# Patient Record
Sex: Female | Born: 1990 | Race: White | Hispanic: No | Marital: Single | Smoking: Never smoker
Health system: Southern US, Community
[De-identification: ages and names within clinical notes are randomized; demographics above are authoritative.]

## PROBLEM LIST (undated history)

## (undated) DIAGNOSIS — I471 Supraventricular tachycardia: Secondary | ICD-10-CM

## (undated) HISTORY — PX: CARDIAC ELECTROPHYSIOLOGY STUDY AND ABLATION: SHX1294

---
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
## 2012-10-22 ENCOUNTER — Emergency Department: Payer: Self-pay | Admitting: Emergency Medicine

## 2012-10-23 LAB — COMPREHENSIVE METABOLIC PANEL
Albumin: 3.7 g/dL (ref 3.4–5.0)
Anion Gap: 4 — ABNORMAL LOW (ref 7–16)
Bilirubin,Total: 0.3 mg/dL (ref 0.2–1.0)
Calcium, Total: 9.5 mg/dL (ref 8.5–10.1)
Chloride: 109 mmol/L — ABNORMAL HIGH (ref 98–107)
Co2: 27 mmol/L (ref 21–32)
EGFR (Non-African Amer.): >60
Osmolality: 279 (ref 275–301)
Sodium: 140 mmol/L (ref 136–145)
Total Protein: 6.7 g/dL (ref 6.4–8.2)

## 2012-10-23 LAB — CBC
MCH: 30.3 pg (ref 26.0–34.0)
MCHC: 34.8 g/dL (ref 32.0–36.0)
Platelet: 288 10*3/uL (ref 150–440)
RDW: 12.5 % (ref 11.5–14.5)
WBC: 7.5 10*3/uL (ref 3.6–11.0)

## 2013-04-23 ENCOUNTER — Ambulatory Visit: Payer: Self-pay | Admitting: General Practice

## 2013-06-25 ENCOUNTER — Ambulatory Visit: Payer: Self-pay | Admitting: General Practice

## 2015-04-12 ENCOUNTER — Encounter: Payer: Self-pay | Admitting: Urgent Care

## 2015-04-12 ENCOUNTER — Observation Stay
Admission: EM | Admit: 2015-04-12 | Discharge: 2015-04-14 | Disposition: A | Discharge status: Home / Self Care | Payer: BLUE CROSS/BLUE SHIELD | Attending: Internal Medicine | Admitting: Internal Medicine

## 2015-04-12 DIAGNOSIS — F329 Major depressive disorder, single episode, unspecified: Secondary | ICD-10-CM | POA: Insufficient documentation

## 2015-04-12 DIAGNOSIS — R55 Syncope and collapse: Secondary | ICD-10-CM | POA: Diagnosis not present

## 2015-04-12 DIAGNOSIS — R000 Tachycardia, unspecified: Secondary | ICD-10-CM | POA: Diagnosis not present

## 2015-04-12 DIAGNOSIS — K3580 Unspecified acute appendicitis: Principal | ICD-10-CM | POA: Insufficient documentation

## 2015-04-12 DIAGNOSIS — Z8679 Personal history of other diseases of the circulatory system: Secondary | ICD-10-CM | POA: Insufficient documentation

## 2015-04-12 DIAGNOSIS — K37 Unspecified appendicitis: Secondary | ICD-10-CM | POA: Insufficient documentation

## 2015-04-12 DIAGNOSIS — R110 Nausea: Secondary | ICD-10-CM | POA: Diagnosis not present

## 2015-04-12 DIAGNOSIS — R002 Palpitations: Secondary | ICD-10-CM | POA: Diagnosis not present

## 2015-04-12 DIAGNOSIS — R531 Weakness: Secondary | ICD-10-CM | POA: Insufficient documentation

## 2015-04-12 DIAGNOSIS — R509 Fever, unspecified: Secondary | ICD-10-CM | POA: Diagnosis not present

## 2015-04-12 DIAGNOSIS — Z79899 Other long term (current) drug therapy: Secondary | ICD-10-CM | POA: Diagnosis not present

## 2015-04-12 DIAGNOSIS — Z9889 Other specified postprocedural states: Secondary | ICD-10-CM | POA: Diagnosis not present

## 2015-04-12 DIAGNOSIS — J111 Influenza due to unidentified influenza virus with other respiratory manifestations: Secondary | ICD-10-CM | POA: Diagnosis not present

## 2015-04-12 DIAGNOSIS — B2790 Infectious mononucleosis, unspecified without complication: Secondary | ICD-10-CM

## 2015-04-12 DIAGNOSIS — R109 Unspecified abdominal pain: Secondary | ICD-10-CM | POA: Diagnosis present

## 2015-04-12 HISTORY — DX: Supraventricular tachycardia: I47.1

## 2015-04-12 LAB — URINALYSIS COMPLETE WITH MICROSCOPIC (ARMC ONLY)
BILIRUBIN URINE: NEGATIVE
Bacteria, UA: NONE SEEN
GLUCOSE, UA: NEGATIVE mg/dL
HGB URINE DIPSTICK: NEGATIVE
KETONES UR: NEGATIVE mg/dL
Leukocytes, UA: NEGATIVE
NITRITE: NEGATIVE
Protein, ur: NEGATIVE mg/dL
SPECIFIC GRAVITY, URINE: 1.004 — AB (ref 1.005–1.030)
pH: 7 (ref 5.0–8.0)

## 2015-04-12 LAB — CBC
HCT: 39.2 % (ref 35.0–47.0)
HEMOGLOBIN: 13.5 g/dL (ref 12.0–16.0)
MCH: 30.1 pg (ref 26.0–34.0)
MCHC: 34.4 g/dL (ref 32.0–36.0)
MCV: 87.5 fL (ref 80.0–100.0)
PLATELETS: 276 10*3/uL (ref 150–440)
RBC: 4.48 MIL/uL (ref 3.80–5.20)
RDW: 12.8 % (ref 11.5–14.5)
WBC: 7.5 10*3/uL (ref 3.6–11.0)

## 2015-04-12 LAB — COMPREHENSIVE METABOLIC PANEL
ALBUMIN: 4.1 g/dL (ref 3.5–5.0)
ALK PHOS: 50 U/L (ref 38–126)
ALT: 16 U/L (ref 14–54)
ANION GAP: 7 (ref 5–15)
AST: 19 U/L (ref 15–41)
BILIRUBIN TOTAL: 0.1 mg/dL — AB (ref 0.3–1.2)
BUN: 9 mg/dL (ref 6–20)
CALCIUM: 8.6 mg/dL — AB (ref 8.9–10.3)
CO2: 26 mmol/L (ref 22–32)
CREATININE: 0.67 mg/dL (ref 0.44–1.00)
Chloride: 97 mmol/L — ABNORMAL LOW (ref 101–111)
GFR calc Af Amer: >60 mL/min (ref >60)
GFR calc non Af Amer: >60 mL/min (ref >60)
GLUCOSE: 92 mg/dL (ref 65–99)
Potassium: 3.4 mmol/L — ABNORMAL LOW (ref 3.5–5.1)
SODIUM: 130 mmol/L — AB (ref 135–145)
TOTAL PROTEIN: 7.6 g/dL (ref 6.5–8.1)

## 2015-04-12 LAB — RAPID INFLUENZA A&B ANTIGENS: Influenza B (ARMC): NEGATIVE

## 2015-04-12 LAB — LIPASE, BLOOD: Lipase: 23 U/L (ref 11–51)

## 2015-04-12 LAB — RAPID INFLUENZA A&B ANTIGENS (ARMC ONLY): INFLUENZA A (ARMC): NEGATIVE

## 2015-04-12 MED ORDER — DIATRIZOATE MEGLUMINE & SODIUM 66-10 % PO SOLN
15.0000 mL | ORAL | Status: AC
  Administered 2015-04-12 – 2015-04-13 (×2): 15 mL via ORAL

## 2015-04-12 MED ORDER — SODIUM CHLORIDE 0.9 % IV BOLUS (SEPSIS)
1000.0000 mL | Freq: Once | INTRAVENOUS | Status: AC
  Administered 2015-04-12: 1000 mL via INTRAVENOUS

## 2015-04-12 MED ORDER — ACETAMINOPHEN 500 MG PO TABS
ORAL_TABLET | ORAL | Status: AC
  Filled 2015-04-12: qty 2

## 2015-04-12 MED ORDER — ACETAMINOPHEN 500 MG PO TABS
1000.0000 mg | ORAL_TABLET | Freq: Once | ORAL | Status: AC
  Administered 2015-04-12: 1000 mg via ORAL

## 2015-04-12 NOTE — ED Notes (Signed)
Patient presents with flu like symptoms that began yesterday. Denies N/V. (+) diffuse abdominal pain. Patient experienced a syncopal episode yesterday; reports LOC x 1-2 minutes. [facility] and was sent here for further evaluation and treatment.

## 2015-04-12 NOTE — ED Provider Notes (Signed)
[facility] Emergency Department Provider Note  ____________________________________________  Time seen: 11:30 PM  I have reviewed the triage vital signs and the nursing notes.   HISTORY  Chief Complaint Influenza; Loss of Consciousness; and Abdominal Pain     HPI Jill Velasquez is a 25 y.o. female presents with flulike symptoms that started yesterday including fever generalized body aches including neck and back, diffuse abdominal pain congestion. Patient admits to a syncopal episode and lasted approximately 2 minutes day before yesterday. Patient states that she thought this was her "orthostatic hypotension".     Past Medical History  Diagnosis Date  . SVT (supraventricular tachycardia) (HCC)     as a child; stopped routine cardiology visits at age 43    There are no active problems to display for this patient.   Past Surgical History  Procedure Laterality Date  . Cardiac electrophysiology study and ablation      No current outpatient prescriptions on file.  Allergies No known drug allergies  No family history on file.  Social History Social History  Substance Use Topics  . Smoking status: Never Smoker   . Smokeless tobacco: None  . Alcohol Use: No    Review of Systems  Constitutional: positive for fever. Eyes: Negative for visual changes. ENT: Negative for sore throat. Cardiovascular: Negative for chest pain. Respiratory: Negative for shortness of breath. Gastrointestinal: Positive abdominal pain Genitourinary: Negative for dysuria. Musculoskeletal: Negative for back pain. Skin: Negative for rash. Neurological: Negative for headaches, focal weakness or numbness.   10-point ROS otherwise negative.  ____________________________________________   PHYSICAL EXAM:  VITAL SIGNS: ED Triage Vitals  Enc Vitals Group     BP 04/12/15 1928 109/71 mmHg     Pulse Rate 04/12/15 1928 130     Resp 04/12/15 1928 22     Temp  04/12/15 1928 101.8 F (38.8 C)     Temp Source 04/12/15 1928 Oral     SpO2 04/12/15 1928 100 %     Weight 04/12/15 1928 115 lb (52.164 kg)     Height 04/12/15 1928  (1.626 m)     Head Cir --      Peak Flow --      Pain Score 04/12/15 1929 5     Pain Loc --      Pain Edu? --      Excl. in GC? --      Constitutional: Alert and oriented. Well appearing and in no distress. Eyes: Conjunctivae are normal. PERRL. Normal extraocular movements. ENT   Head: Normocephalic and atraumatic.   Nose: No congestion/rhinnorhea.   Mouth/Throat: Mucous membranes are moist.   Neck: No stridor. Hematological/Lymphatic/Immunilogical: No cervical lymphadenopathy. Cardiovascular: Normal rate, regular rhythm. Normal and symmetric distal pulses are present in all extremities. No murmurs, rubs, or gallops. Respiratory: Normal respiratory effort without tachypnea nor retractions. Breath sounds are clear and equal bilaterally. No wheezes/rales/rhonchi. Gastrointestinal: RLQ pain No distention. There is no CVA tenderness. Genitourinary: deferred Musculoskeletal: Nontender with normal range of motion in all extremities. No joint effusions.  No lower extremity tenderness nor edema. Neurologic:  Normal speech and language. No gross focal neurologic deficits are appreciated. Speech is normal.  Skin:  Skin is warm, dry and intact. No rash noted. Psychiatric: Mood and affect are normal. Speech and behavior are normal. Patient exhibits appropriate insight and judgment.  ____________________________________________    LABS (pertinent positives/negatives)  Labs Reviewed  COMPREHENSIVE METABOLIC PANEL - Abnormal; Notable for the following:  Sodium 130 (*)    Potassium 3.4 (*)    Chloride 97 (*)    Calcium 8.6 (*)    Total Bilirubin 0.1 (*)    All other components within normal limits  URINALYSIS COMPLETEWITH MICROSCOPIC ([facility] ONLY) - Abnormal; Notable for the following:    Color, Urine  STRAW (*)    APPearance CLEAR (*)    Specific Gravity, Urine 1.004 (*)    Squamous Epithelial / LPF 0-5 (*)    All other components within normal limits  MONONUCLEOSIS SCREEN - Abnormal; Notable for the following:    Mono Screen POSITIVE (*)    All other components within normal limits  RAPID INFLUENZA A&B ANTIGENS ([facility] ONLY)  LIPASE, BLOOD  CBC  PREGNANCY, URINE  INFLUENZA PANEL BY PCR (TYPE A & B, H1N1)  POC URINE PREG, ED     __    RADIOLOGY  CT Abdomen Pelvis W Contrast (Final result) Result time: 04/13/15 02:04:49   Final result by Rad Results In Interface (04/13/15 02:04:49)   Narrative:   CLINICAL DATA: Flu-like symptoms yesterday. Diffuse abdominal pain tonight.  EXAM: CT ABDOMEN AND PELVIS WITH CONTRAST  TECHNIQUE: Multidetector CT imaging of the abdomen and pelvis was performed using the standard protocol following bolus administration of intravenous contrast.  CONTRAST: 100mL ISOVUE-300 IOPAMIDOL (ISOVUE-300) INJECTION 61%  COMPARISON: None.  FINDINGS: There is inflammation and enlargement of the appendix with appearances typical of acute appendicitis. No abscess. No extraluminal air. No other acute findings are evident in the abdomen or pelvis.  There are normal appearances of the liver, gallbladder, bile ducts, pancreas, spleen, adrenals and kidneys. Uterus and ovaries appear unremarkable.  There is no significant abnormality in the lower chest.  There is no significant musculoskeletal abnormality.  IMPRESSION: Acute appendicitis. These results were called by telephone at the time of interpretation on 04/13/2015 at 2:03 am to Dr. Bayard MalesANDOLPH BROWN , who verbally acknowledged these results.   Electronically Signed By: Ellery Plunkaniel R Mitchell M.D. On: 04/13/2015 02:04        ECG Results     __   INITIAL IMPRESSION / ASSESSMENT AND PLAN / ED COURSE  Pertinent labs & imaging results that were available during my care of the patient  were reviewed by me and considered in my medical decision making (see chart for details). Patient discussed with Dr. Excell Seltzerooper for possible admission  ____________________________________________   FINAL CLINICAL IMPRESSION(S) / ED DIAGNOSES  Final diagnoses:  Appendicitis, unspecified appendicitis type  Mononucleosis      Darci Currentandolph N Brown, MD 04/13/15 [phone]

## 2015-04-12 NOTE — ED Notes (Signed)
Lab results reviewed, awaiting flu swab results. Also awaiting room for MD eval.

## 2015-04-12 NOTE — ED Notes (Signed)
Further lab results reviewed. Flu swab negative.

## 2015-04-13 ENCOUNTER — Emergency Department: Payer: BLUE CROSS/BLUE SHIELD

## 2015-04-13 DIAGNOSIS — R109 Unspecified abdominal pain: Secondary | ICD-10-CM | POA: Diagnosis not present

## 2015-04-13 LAB — MONONUCLEOSIS SCREEN: Mono Screen: POSITIVE — AB

## 2015-04-13 LAB — TROPONIN I: Troponin I: <0.03 ng/mL (ref <0.031)

## 2015-04-13 LAB — PREGNANCY, URINE: PREG TEST UR: NEGATIVE

## 2015-04-13 MED ORDER — LACTATED RINGERS IV BOLUS (SEPSIS)
1000.0000 mL | Freq: Once | INTRAVENOUS | Status: AC
  Administered 2015-04-13: 1000 mL via INTRAVENOUS

## 2015-04-13 MED ORDER — DEXTROSE IN LACTATED RINGERS 5 % IV SOLN
INTRAVENOUS | Status: DC
  Administered 2015-04-13: 03:00:00 via INTRAVENOUS
  Administered 2015-04-13: 1000 mL via INTRAVENOUS
  Administered 2015-04-13 – 2015-04-14 (×2): via INTRAVENOUS

## 2015-04-13 MED ORDER — ONDANSETRON HCL 4 MG PO TABS: 4.0000 mg | ORAL_TABLET | Freq: Four times a day (QID) | ORAL | Status: DC | PRN

## 2015-04-13 MED ORDER — ONDANSETRON HCL 4 MG/2ML IJ SOLN
4.0000 mg | Freq: Four times a day (QID) | INTRAMUSCULAR | Status: DC | PRN
  Administered 2015-04-13: 4 mg via INTRAVENOUS
  Filled 2015-04-13: qty 2

## 2015-04-13 MED ORDER — IOPAMIDOL (ISOVUE-300) INJECTION 61%
100.0000 mL | Freq: Once | INTRAVENOUS | Status: AC | PRN
  Administered 2015-04-13: 100 mL via INTRAVENOUS

## 2015-04-13 MED ORDER — HEPARIN SODIUM (PORCINE) 5000 UNIT/ML IJ SOLN
5000.0000 [IU] | Freq: Three times a day (TID) | INTRAMUSCULAR | Status: DC
  Administered 2015-04-13 – 2015-04-14 (×3): 5000 [IU] via SUBCUTANEOUS
  Filled 2015-04-13 (×4): qty 1

## 2015-04-13 MED ORDER — CEFAZOLIN SODIUM 1-5 GM-% IV SOLN: 1.0000 g | Freq: Once | INTRAVENOUS | Status: DC

## 2015-04-13 MED ORDER — VENLAFAXINE HCL ER 75 MG PO CP24
75.0000 mg | ORAL_CAPSULE | Freq: Every day | ORAL | Status: DC
  Administered 2015-04-13: 75 mg via ORAL
  Filled 2015-04-13: qty 1

## 2015-04-13 MED ORDER — ONDANSETRON HCL 4 MG/2ML IJ SOLN
4.0000 mg | Freq: Once | INTRAMUSCULAR | Status: AC
  Administered 2015-04-13: 4 mg via INTRAVENOUS

## 2015-04-13 MED ORDER — ACETAMINOPHEN 325 MG PO TABS
650.0000 mg | ORAL_TABLET | Freq: Four times a day (QID) | ORAL | Status: DC | PRN
  Administered 2015-04-13 – 2015-04-14 (×2): 650 mg via ORAL
  Filled 2015-04-13: qty 2

## 2015-04-13 MED ORDER — DEXTROSE 5 % IV SOLN
2.0000 g | INTRAVENOUS | Status: AC
  Administered 2015-04-14: 2 g via INTRAVENOUS
  Filled 2015-04-13: qty 2

## 2015-04-13 MED ORDER — MORPHINE SULFATE (PF) 2 MG/ML IV SOLN
2.0000 mg | INTRAVENOUS | Status: DC | PRN
  Administered 2015-04-13 – 2015-04-14 (×3): 2 mg via INTRAVENOUS
  Filled 2015-04-13 (×4): qty 1

## 2015-04-13 MED ORDER — ONDANSETRON HCL 4 MG/2ML IJ SOLN
INTRAMUSCULAR | Status: AC
  Administered 2015-04-13: 4 mg via INTRAVENOUS
  Filled 2015-04-13: qty 2

## 2015-04-13 NOTE — Consult Note (Signed)
[facility] [place] at [facility]   PATIENT NAME: Jill Velasquez    MR#:  [id number]  DATE OF BIRTH:  [birth year]  DATE OF ADMISSION:  04/12/2015  PRIMARY CARE PHYSICIAN: BABAOFF, Lavada Mesi, MD   CONSULT REQUESTING/REFERRING PHYSICIAN: Dr. Everlene Farrier  REASON FOR CONSULT: Syncope, mononucleosis  CHIEF COMPLAINT:   Chief Complaint  Patient presents with  . Influenza  . Loss of Consciousness  . Abdominal Pain    HISTORY OF PRESENT ILLNESS:  Jill Velasquez  is a 25 y.o. female with a known history of SVT with cardiac ablation as a child presented to the hospital complaining of abdominal pain, fever and fatigue. CT scan showed acute appendicitis. Patient did not have typical symptoms of appendicitis and at this point conservative management is being followed. She has been tentatively scheduled for surgery tomorrow. Prior to admission on Tuesday patient woke up in the morning and walk to the bathroom and felt lightheaded which made her sit down and she blacked out. When she woke up immediately did not fall to the floor. Possibly for a few seconds. She did experience palpitations one or 2 minutes prior to this episode. She also has history of orthostatic hypotension. She has been dealing with a cold for the last week and has treated with Zyrtec. In the hospital monitor test is positive. No shortness of breath, chest pain.  PAST MEDICAL HISTORY:   Past Medical History  Diagnosis Date  . SVT (supraventricular tachycardia) (HCC)     as a child; stopped routine cardiology visits at age 60    PAST SURGICAL HISTOIRY:   Past Surgical History  Procedure Laterality Date  . Cardiac electrophysiology study and ablation      SOCIAL HISTORY:   Social History  Substance Use Topics  . Smoking status: Never Smoker   . Smokeless tobacco: Not on file  . Alcohol Use: No    FAMILY HISTORY:   Family History  Problem Relation Age of Onset  . Congenital heart disease  Cousin     DRUG ALLERGIES:  No Known Allergies  REVIEW OF SYSTEMS:   ROS  CONSTITUTIONAL: Positive fever, fatigue or weakness.  EYES: No blurred or double vision.  EARS, NOSE, AND THROAT: Sinus congestion. RESPIRATORY: No cough, shortness of breath, wheezing or hemoptysis.  CARDIOVASCULAR: No chest pain, orthopnea, edema.  GASTROINTESTINAL: No nausea, vomiting, diarrhea. Lower abdominal pain GENITOURINARY: No dysuria, hematuria.  ENDOCRINE: No polyuria, nocturia,  HEMATOLOGY: No anemia, easy bruising or bleeding SKIN: No rash or lesion. MUSCULOSKELETAL: No joint pain or arthritis.   NEUROLOGIC: No tingling, numbness, weakness.  PSYCHIATRY: No anxiety or depression.   MEDICATIONS AT HOME:   Prior to Admission medications   Medication Sig Start Date End Date Taking? Authorizing Provider  Norgestimate-Ethinyl Estradiol Triphasic 0.18/0.215/0.25 MG-35 MCG tablet Take 1 tablet by mouth daily. 03/14/15  Yes Historical Provider, MD  spironolactone (ALDACTONE) 25 MG tablet Take 1 tablet by mouth daily. 04/04/15  Yes Historical Provider, MD  traZODone (DESYREL) 50 MG tablet Take 1 tablet by mouth at bedtime as needed. 04/04/15  Yes Historical Provider, MD  venlafaxine XR (EFFEXOR-XR) 75 MG 24 hr capsule Take 1 capsule by mouth daily. 04/07/15  Yes Historical Provider, MD      VITAL SIGNS:  Blood pressure 107/62, pulse 102, temperature 98.9 F (37.2 C), temperature source Oral, resp. rate 20, height  (1.626 m), weight 52.164 kg (115 lb), last menstrual period 04/10/2015, SpO2 99 %.  PHYSICAL EXAMINATION:  GENERAL:  25 y.o.-year-old patient lying in the bed with no acute distress.  EYES: Pupils equal, round, reactive to light and accommodation. No scleral icterus. Extraocular muscles intact.  HEENT: Head atraumatic, normocephalic. Oropharynx and nasopharynx clear.  NECK:  Supple, no jugular venous distention. No thyroid enlargement, no tenderness.  LUNGS: Normal breath sounds  bilaterally, no wheezing, rales,rhonchi or crepitation. No use of accessory muscles of respiration.  CARDIOVASCULAR: S1, S2 normal. No murmurs, rubs, or gallops.  ABDOMEN: Soft, nondistended. Bowel sounds present. No organomegaly or mass. Mild tenderness on deep palpation in lower abdomen. EXTREMITIES: No pedal edema, cyanosis, or clubbing.  NEUROLOGIC: Cranial nerves II through XII are intact. Muscle strength 5/5 in all extremities. Sensation intact. Gait not checked.  PSYCHIATRIC: The patient is alert and oriented x 3.  SKIN: No obvious rash, lesion, or ulcer.   LABORATORY PANEL:   CBC  Recent Labs Lab 04/12/15 1948  WBC 7.5  HGB 13.5  HCT 39.2  PLT 276   ------------------------------------------------------------------------------------------------------------------  Chemistries   Recent Labs Lab 04/12/15 1948  NA 130*  K 3.4*  CL 97*  CO2 26  GLUCOSE 92  BUN 9  CREATININE 0.67  CALCIUM 8.6*  AST 19  ALT 16  ALKPHOS 50  BILITOT 0.1*   ------------------------------------------------------------------------------------------------------------------  Cardiac Enzymes No results for input(s): TROPONINI in the last 168 hours. ------------------------------------------------------------------------------------------------------------------  RADIOLOGY:  Ct Abdomen Pelvis W Contrast  04/13/2015  CLINICAL DATA:  Flu-like symptoms yesterday. Diffuse abdominal pain tonight. EXAM: CT ABDOMEN AND PELVIS WITH CONTRAST TECHNIQUE: Multidetector CT imaging of the abdomen and pelvis was performed using the standard protocol following bolus administration of intravenous contrast. CONTRAST:  100mL ISOVUE-300 IOPAMIDOL (ISOVUE-300) INJECTION 61% COMPARISON:  None. FINDINGS: There is inflammation and enlargement of the appendix with appearances typical of acute appendicitis. No abscess. No extraluminal air. No other acute findings are evident in the abdomen or pelvis. There are normal  appearances of the liver, gallbladder, bile ducts, pancreas, spleen, adrenals and kidneys. Uterus and ovaries appear unremarkable. There is no significant abnormality in the lower chest. There is no significant musculoskeletal abnormality. IMPRESSION: Acute appendicitis. These results were called by telephone at the time of interpretation on 04/13/2015 at 2:03 am to Dr. Bayard MalesANDOLPH BROWN , who verbally acknowledged these results. Electronically Signed   By: Ellery Plunkaniel R Mitchell M.D.   On: 04/13/2015 02:04    EKG:  No orders found for this or any previous visit.  IMPRESSION AND PLAN:   * Syncope Seems orthostatic. Orthostatic vitals. But patient had palpitations for 1-2 minutes prior to the episode. Telemetry monitoring. Troponin. No chest pain or shortness of breath.  * Infectious mononucleosis Symptomatic treatment. No splenomegaly on CT scan.  * Acute appendicitis on CT scan Management as per surgery  All the records are reviewed. Management plans discussed with the patient, family and they are in agreement.  TOTAL TIME TAKING CARE OF THIS PATIENT: 40 minutes.   Milagros LollSudini, Taisei Bonnette R M.D on 04/13/2015 at 2:38 PM  Between 7am to 6pm - Pager - [phone]  After 6pm go to [email or web address] - password [email or web address] [facility]  [facility]  Office  [phone]  CC: Primary care Physician: Rozanna BoxBABAOFF, MARC E, MD  Note: This dictation was prepared with Dragon dictation along with smaller phrase technology. Any transcriptional errors that result from this process are unintentional.

## 2015-04-13 NOTE — Progress Notes (Signed)
Called Dr. Hilton SinclairWeiting  On urgent consult for this patient. Dr. Hilton SinclairWeiting stated that Dr. Elpidio AnisSudini will see the patient.

## 2015-04-13 NOTE — Progress Notes (Signed)
Called ED to ask about report after 1hr 50min, post acceptance of bed assignment. Windy Carinaurner,Rilea Arutyunyan K, RN 4:57 AM 04/13/2015

## 2015-04-13 NOTE — ED Notes (Signed)
Patient transported to CT 

## 2015-04-13 NOTE — H&P (Signed)
Jill Velasquez is an 25 y.o. female.    Chief Complaint: abd pain  HPI: This patient with abdominal pain Dr. Owens Shark and relayed to me a history that I confirmed with the patient and her mother that she had abdominal pain that started yesterday less than 24 hours ago she never had an episode like this before but has had abdominal pain in the past when asked to localize the pain she cannot localize of the right lower quadrant but states that it is in the lower quadrants below the bellybutton and points more to the left than the right however a CT scan has suggested appendicitis. Patient has also been diagnosed with mononucleosis today and is on droplet precautions. She's been nauseated but has not vomited she's had fevers to 102  Patient has had a cardiac ablation for SVT as a child and takes multiple medications for her scan and depression. She's had no other abdominal surgeries. 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 SVT (supraventricular tachycardia) (HCC)     as a child; stopped routine cardiology visits at age 19    Past Surgical History  Procedure Laterality Date  . Cardiac electrophysiology study and ablation      No family history on file. Social History:  reports that she has never smoked. She does not have any smokeless tobacco history on file. She reports that she does not drink alcohol. Her drug history is not on file.  Allergies: No Known Allergies   (Not in a hospital admission)   Review of Systems  Constitutional: Positive for fever. Negative for chills, weight loss, malaise/fatigue and diaphoresis.  HENT: Negative.   Eyes: Negative.   Respiratory: Negative.   Cardiovascular: Negative.   Gastrointestinal: Positive for nausea, abdominal pain and diarrhea. Negative for heartburn, vomiting, constipation, blood in stool and melena.       Patient is not able to localize the pain to  the right lower quadrant  Genitourinary: Negative.   Musculoskeletal: Positive for myalgias and joint pain. Negative for back pain, falls and neck pain.  Skin: Negative.   Neurological: Negative.  Negative for weakness.  Endo/Heme/Allergies: Negative.   Psychiatric/Behavioral: Negative.      Physical Exam:  BP 121/82 mmHg  Pulse 90  Temp(Src) 99.2 F (37.3 C) (Oral)  Resp 16  Ht 5' 4"  (1.626 m)  Wt 115 lb (52.164 kg)  BMI 19.73 kg/m2  SpO2 100%  LMP 04/10/2015  Physical Exam  Constitutional: She is oriented to person, place, and time and well-developed, well-nourished, and in no distress. No distress.  Appears comfortable and moves about the bed easily  HENT:  Head: Normocephalic and atraumatic.  Scar on forehead  Eyes: Pupils are equal, round, and reactive to light. Right eye exhibits no discharge. Left eye exhibits no discharge. No scleral icterus.  Neck: Normal range of motion.  Cardiovascular: Normal rate, regular rhythm and normal heart sounds.   Pulmonary/Chest: Effort normal and breath sounds normal. No respiratory distress. She has no wheezes. She has no rales.  Abdominal: Soft. She exhibits no distension. There is no tenderness. There is no rebound and no guarding.  Patient's abdominal exam is completely benign and there is no tenderness no guarding or rebound no percussion tenderness no distention and no mass  Musculoskeletal: Normal range of motion. She exhibits no edema or tenderness.  Lymphadenopathy:    She has no cervical  adenopathy.  Neurological: She is alert and oriented to person, place, and time.  Skin: Skin is warm and dry. No rash noted. She is not diaphoretic. No erythema.  Psychiatric: Mood and affect normal.  Vitals reviewed.       Results for orders placed or performed during the hospital encounter of 04/12/15 (from the past 48 hour(s))  Lipase, blood     Status: None   Collection Time: 04/12/15  7:48 PM  Result Value Ref Range   Lipase 23  11 - 51 U/L  Comprehensive metabolic panel     Status: Abnormal   Collection Time: 04/12/15  7:48 PM  Result Value Ref Range   Sodium 130 (L) 135 - 145 mmol/L   Potassium 3.4 (L) 3.5 - 5.1 mmol/L   Chloride 97 (L) 101 - 111 mmol/L   CO2 26 22 - 32 mmol/L   Glucose, Bld 92 65 - 99 mg/dL   BUN 9 6 - 20 mg/dL   Creatinine, Ser 0.67 0.44 - 1.00 mg/dL   Calcium 8.6 (L) 8.9 - 10.3 mg/dL   Total Protein 7.6 6.5 - 8.1 g/dL   Albumin 4.1 3.5 - 5.0 g/dL   AST 19 15 - 41 U/L   ALT 16 14 - 54 U/L   Alkaline Phosphatase 50 38 - 126 U/L   Total Bilirubin 0.1 (L) 0.3 - 1.2 mg/dL   GFR calc non Af Amer >60 >60 mL/min   GFR calc Af Amer >60 >60 mL/min    Comment: (NOTE) The eGFR has been calculated using the CKD EPI equation. This calculation has not been validated in all clinical situations. eGFR's persistently <60 mL/min signify possible Chronic Kidney Disease.    Anion gap 7 5 - 15  CBC     Status: None   Collection Time: 04/12/15  7:48 PM  Result Value Ref Range   WBC 7.5 3.6 - 11.0 K/uL   RBC 4.48 3.80 - 5.20 MIL/uL   Hemoglobin 13.5 12.0 - 16.0 g/dL   HCT 39.2 35.0 - 47.0 %   MCV 87.5 80.0 - 100.0 fL   MCH 30.1 26.0 - 34.0 pg   MCHC 34.4 32.0 - 36.0 g/dL   RDW 12.8 11.5 - 14.5 %   Platelets 276 150 - 440 K/uL  Urinalysis complete, with microscopic ([facility] only)     Status: Abnormal   Collection Time: 04/12/15  7:48 PM  Result Value Ref Range   Color, Urine STRAW (A) YELLOW   APPearance CLEAR (A) CLEAR   Glucose, UA NEGATIVE NEGATIVE mg/dL   Bilirubin Urine NEGATIVE NEGATIVE   Ketones, ur NEGATIVE NEGATIVE mg/dL   Specific Gravity, Urine 1.004 (L) 1.005 - 1.030   Hgb urine dipstick NEGATIVE NEGATIVE   pH 7.0 5.0 - 8.0   Protein, ur NEGATIVE NEGATIVE mg/dL   Nitrite NEGATIVE NEGATIVE   Leukocytes, UA NEGATIVE NEGATIVE   RBC / HPF 0-5 0 - 5 RBC/hpf   WBC, UA 0-5 0 - 5 WBC/hpf   Bacteria, UA NONE SEEN NONE SEEN   Squamous Epithelial / LPF 0-5 (A) NONE SEEN  Rapid Influenza A&B  Antigens ([facility] only)     Status: None   Collection Time: 04/12/15  7:48 PM  Result Value Ref Range   Influenza A ([facility]) NEGATIVE NEGATIVE   Influenza B ([facility]) NEGATIVE NEGATIVE  Mononucleosis screen     Status: Abnormal   Collection Time: 04/12/15  7:48 PM  Result Value Ref Range   Mono Screen POSITIVE (A) NEGATIVE  Pregnancy, urine     Status: None   Collection Time: 04/12/15  7:48 PM  Result Value Ref Range   Preg Test, Ur NEGATIVE NEGATIVE   Ct Abdomen Pelvis W Contrast  04/13/2015  CLINICAL DATA:  Flu-like symptoms yesterday. Diffuse abdominal pain tonight. EXAM: CT ABDOMEN AND PELVIS WITH CONTRAST TECHNIQUE: Multidetector CT imaging of the abdomen and pelvis was performed using the standard protocol following bolus administration of intravenous contrast. CONTRAST:  184m ISOVUE-300 IOPAMIDOL (ISOVUE-300) INJECTION 61% COMPARISON:  None. FINDINGS: There is inflammation and enlargement of the appendix with appearances typical of acute appendicitis. No abscess. No extraluminal air. No other acute findings are evident in the abdomen or pelvis. There are normal appearances of the liver, gallbladder, bile ducts, pancreas, spleen, adrenals and kidneys. Uterus and ovaries appear unremarkable. There is no significant abnormality in the lower chest. There is no significant musculoskeletal abnormality. IMPRESSION: Acute appendicitis. These results were called by telephone at the time of interpretation on 04/13/2015 at 2:03 am to Dr. RMarjean Donna, who verbally acknowledged these results. Electronically Signed   By: DAndreas NewportM.D.   On: 04/13/2015 02:04     Assessment/Plan  This a patient with a very unusual story for appendicitis but CT findings suggesting appendicitis she also has mononucleosis. Dr. BOwens Sharkand reviewed for me her myalgias and other symptoms as well. My exam of the patient today suggests that she has no abdominal tenderness and an exam that is completely inconsistent with  appendicitis and the need for an operation at this point. However with the CT findings she warrants observation I will not start antibiotics and a patient with mononucleosis and a normal white blood cell count but would rather reexamine the patient later and if her exam becomes more consistent with appendicitis then surgical intervention would be indicated and that was discussed with patient and mother.  RFlorene Glen MD, FACS

## 2015-04-13 NOTE — Progress Notes (Signed)
CC: Abd pain Subjective: Syncope episode, + mono and atypical abdominal pain. She continue to have lower abdominal pain and decrease appetite, pain is more towards her left side. Some nausea.  Objective: Vital signs in last 24 hours: Temp:  [98.1 F (36.7 C)-101.8 F (38.8 C)] 98.2 F (36.8 C) (04/06 1442) Pulse Rate:  [82-130] 104 (04/06 1442) Resp:  [9-22] 20 (04/06 1442) BP: (102-121)/(62-84) 102/72 mmHg (04/06 1442) SpO2:  [99 %-100 %] 100 % (04/06 1442) Weight:  [52.164 kg (115 lb)] 52.164 kg (115 lb) (04/05 1928) Last BM Date: 04/12/15  Intake/Output from previous day: 04/05 0701 - 04/06 0700 In: 530 [P.O.:30; I.V.:500] Out: -  Intake/Output this shift: Total I/O In: 1374.6 [I.V.:1374.6] Out: -   Physical exam: NAD Abd: soft, Mild TTP on both RLQ and LLQ, no peritonitis Ext: well perfused  Lab Results: CBC   Recent Labs  04/12/15 1948  WBC 7.5  HGB 13.5  HCT 39.2  PLT 276   BMET  Recent Labs  04/12/15 1948  NA 130*  K 3.4*  CL 97*  CO2 26  GLUCOSE 92  BUN 9  CREATININE 0.67  CALCIUM 8.6*   PT/INR No results for input(s): LABPROT, INR in the last 72 hours. ABG No results for input(s): PHART, HCO3 in the last 72 hours.  Invalid input(s): PCO2, PO2  Studies/Results: Ct Abdomen Pelvis W Contrast  04/13/2015  CLINICAL DATA:  Flu-like symptoms yesterday. Diffuse abdominal pain tonight. EXAM: CT ABDOMEN AND PELVIS WITH CONTRAST TECHNIQUE: Multidetector CT imaging of the abdomen and pelvis was performed using the standard protocol following bolus administration of intravenous contrast. CONTRAST:  100mL ISOVUE-300 IOPAMIDOL (ISOVUE-300) INJECTION 61% COMPARISON:  None. FINDINGS: There is inflammation and enlargement of the appendix with appearances typical of acute appendicitis. No abscess. No extraluminal air. No other acute findings are evident in the abdomen or pelvis. There are normal appearances of the liver, gallbladder, bile ducts, pancreas, spleen,  adrenals and kidneys. Uterus and ovaries appear unremarkable. There is no significant abnormality in the lower chest. There is no significant musculoskeletal abnormality. IMPRESSION: Acute appendicitis. These results were called by telephone at the time of interpretation on 04/13/2015 at 2:03 am to Dr. Bayard MalesANDOLPH BROWN , who verbally acknowledged these results. Electronically Signed   By: Ellery Plunkaniel R Mitchell M.D.   On: 04/13/2015 02:04    Anti-infectives: Anti-infectives    Start     Dose/Rate Route Frequency Ordered Stop   04/13/15 0215  ceFAZolin (ANCEF) IVPB 1 g/50 mL premix  Status:  Discontinued     1 g 100 mL/hr over 30 Minutes Intravenous  Once 04/13/15 0209 04/13/15 0234      Assessment/Plan: Abdominal  pain questional appendicitis. Discussed with the patient and the family in detail about the circumstances patient is still has persistent abdominal pain we will watch for today but if she persists with pain we will definitely need to perform a diagnostic laparoscopy to rule out appendicitis and to prevent of possible perforation. And she did have a fever and temperature but that has subsided although her pain persists. We'll also arrange for internal medicine consultation regarding her syncopal episode. An we will tentatively post her for diagnostic laparoscopy tomorrow but if she improves we may cancel it. Discussed with the patient and the mother in detail and they understand Sterling Bigiego Pabon, MD, [facility]  04/13/2015

## 2015-04-13 NOTE — ED Notes (Signed)
Pt back to room and "upset stomach", Dr Manson PasseyBrown notified, orders rx'd

## 2015-04-13 NOTE — ED Notes (Signed)
PT FINISHED DRINKING CONTRAST. CT NOTIFIED. 

## 2015-04-14 ENCOUNTER — Encounter
Admission: EM | Disposition: A | Discharge status: Home / Self Care | Payer: Self-pay | Source: Home / Self Care | Attending: Emergency Medicine

## 2015-04-14 ENCOUNTER — Observation Stay: Payer: BLUE CROSS/BLUE SHIELD | Admitting: Anesthesiology

## 2015-04-14 ENCOUNTER — Encounter: Payer: Self-pay | Admitting: Anesthesiology

## 2015-04-14 DIAGNOSIS — K37 Unspecified appendicitis: Secondary | ICD-10-CM | POA: Insufficient documentation

## 2015-04-14 HISTORY — PX: LAPAROSCOPIC APPENDECTOMY: SHX408

## 2015-04-14 LAB — BASIC METABOLIC PANEL
ANION GAP: 4 — AB (ref 5–15)
BUN: <5 mg/dL — ABNORMAL LOW (ref 6–20)
CHLORIDE: 105 mmol/L (ref 101–111)
CO2: 27 mmol/L (ref 22–32)
Calcium: 8.5 mg/dL — ABNORMAL LOW (ref 8.9–10.3)
Creatinine, Ser: 0.48 mg/dL (ref 0.44–1.00)
GFR calc non Af Amer: >60 mL/min (ref >60)
Glucose, Bld: 117 mg/dL — ABNORMAL HIGH (ref 65–99)
Potassium: 3.9 mmol/L (ref 3.5–5.1)
Sodium: 136 mmol/L (ref 135–145)

## 2015-04-14 LAB — CBC
HEMATOCRIT: 34.6 % — AB (ref 35.0–47.0)
HEMOGLOBIN: 11.9 g/dL — AB (ref 12.0–16.0)
MCH: 30.8 pg (ref 26.0–34.0)
MCHC: 34.5 g/dL (ref 32.0–36.0)
MCV: 89.2 fL (ref 80.0–100.0)
Platelets: 222 10*3/uL (ref 150–440)
RBC: 3.88 MIL/uL (ref 3.80–5.20)
RDW: 12.7 % (ref 11.5–14.5)
WBC: 3.5 10*3/uL — AB (ref 3.6–11.0)

## 2015-04-14 LAB — MRSA PCR SCREENING: MRSA by PCR: NEGATIVE

## 2015-04-14 SURGERY — APPENDECTOMY, LAPAROSCOPIC
Anesthesia: General | Wound class: Clean Contaminated

## 2015-04-14 MED ORDER — FENTANYL CITRATE (PF) 100 MCG/2ML IJ SOLN
INTRAMUSCULAR | Status: DC | PRN
  Administered 2015-04-14: 100 ug via INTRAVENOUS

## 2015-04-14 MED ORDER — FENTANYL CITRATE (PF) 100 MCG/2ML IJ SOLN
25.0000 ug | INTRAMUSCULAR | Status: DC | PRN
  Administered 2015-04-14 (×4): 25 ug via INTRAVENOUS

## 2015-04-14 MED ORDER — LIDOCAINE HCL 2 % EX GEL
CUTANEOUS | Status: DC | PRN
  Administered 2015-04-14: 1 via TOPICAL

## 2015-04-14 MED ORDER — FENTANYL CITRATE (PF) 100 MCG/2ML IJ SOLN
INTRAMUSCULAR | Status: AC
  Administered 2015-04-14: 25 ug via INTRAVENOUS
  Filled 2015-04-14: qty 2

## 2015-04-14 MED ORDER — OXYCODONE-ACETAMINOPHEN 7.5-325 MG PO TABS: 1.0000 | ORAL_TABLET | ORAL | Status: DC | PRN

## 2015-04-14 MED ORDER — ACETAMINOPHEN 10 MG/ML IV SOLN
INTRAVENOUS | Status: DC | PRN
  Administered 2015-04-14: 1000 mg via INTRAVENOUS

## 2015-04-14 MED ORDER — ROCURONIUM BROMIDE 100 MG/10ML IV SOLN
INTRAVENOUS | Status: DC | PRN
  Administered 2015-04-14: 40 mg via INTRAVENOUS

## 2015-04-14 MED ORDER — ONDANSETRON HCL 4 MG/2ML IJ SOLN
INTRAMUSCULAR | Status: DC | PRN
  Administered 2015-04-14: 4 mg via INTRAVENOUS

## 2015-04-14 MED ORDER — KETOROLAC TROMETHAMINE 30 MG/ML IJ SOLN
INTRAMUSCULAR | Status: DC | PRN
  Administered 2015-04-14: 30 mg via INTRAVENOUS

## 2015-04-14 MED ORDER — DEXAMETHASONE SODIUM PHOSPHATE 10 MG/ML IJ SOLN
INTRAMUSCULAR | Status: DC | PRN
  Administered 2015-04-14: 10 mg via INTRAVENOUS

## 2015-04-14 MED ORDER — ACETAMINOPHEN 10 MG/ML IV SOLN
INTRAVENOUS | Status: AC
  Filled 2015-04-14: qty 100

## 2015-04-14 MED ORDER — PROPOFOL 10 MG/ML IV BOLUS
INTRAVENOUS | Status: DC | PRN
  Administered 2015-04-14: 150 mg via INTRAVENOUS

## 2015-04-14 MED ORDER — MIDAZOLAM HCL 2 MG/2ML IJ SOLN
INTRAMUSCULAR | Status: DC | PRN
  Administered 2015-04-14: 2 mg via INTRAVENOUS

## 2015-04-14 MED ORDER — LIDOCAINE HCL (CARDIAC) 20 MG/ML IV SOLN
INTRAVENOUS | Status: DC | PRN
  Administered 2015-04-14: 100 mg via INTRAVENOUS

## 2015-04-14 MED ORDER — BUPIVACAINE-EPINEPHRINE (PF) 0.25% -1:200000 IJ SOLN
INTRAMUSCULAR | Status: AC
  Filled 2015-04-14: qty 30

## 2015-04-14 MED ORDER — SUGAMMADEX SODIUM 200 MG/2ML IV SOLN
INTRAVENOUS | Status: DC | PRN
  Administered 2015-04-14: 200 mg via INTRAVENOUS

## 2015-04-14 MED ORDER — LACTATED RINGERS IV SOLN
INTRAVENOUS | Status: DC | PRN
  Administered 2015-04-14: 09:00:00 via INTRAVENOUS

## 2015-04-14 MED ORDER — ACETAMINOPHEN 325 MG PO TABS
650.0000 mg | ORAL_TABLET | Freq: Four times a day (QID) | ORAL | Status: DC | PRN
Start: 2015-04-14 — End: 2015-04-14

## 2015-04-14 MED ORDER — ONDANSETRON HCL 4 MG/2ML IJ SOLN: 4.0000 mg | Freq: Once | INTRAMUSCULAR | Status: DC | PRN

## 2015-04-14 MED ORDER — KETOROLAC TROMETHAMINE 30 MG/ML IJ SOLN: 30.0000 mg | Freq: Four times a day (QID) | INTRAMUSCULAR | Status: DC

## 2015-04-14 SURGICAL SUPPLY — 33 items
APPLIER CLIP 5 13 M/L LIGAMAX5 (MISCELLANEOUS) ×2
BLADE CLIPPER SURG (BLADE) ×2 IMPLANT
BLADE SURG SZ11 CARB STEEL (BLADE) ×2 IMPLANT
CANISTER SUCT 3000ML (MISCELLANEOUS) ×2 IMPLANT
CHLORAPREP W/TINT 26ML (MISCELLANEOUS) ×2 IMPLANT
CLIP APPLIE 5 13 M/L LIGAMAX5 (MISCELLANEOUS) ×1 IMPLANT
CUTTER FLEX LINEAR 45M (STAPLE) ×2 IMPLANT
ELECT REM PT RETURN 9FT ADLT (ELECTROSURGICAL) ×2
ELECTRODE REM PT RTRN 9FT ADLT (ELECTROSURGICAL) ×1 IMPLANT
ENDOPOUCH RETRIEVER 10 (MISCELLANEOUS) ×2 IMPLANT
GLOVE BIO SURGEON STRL SZ7 (GLOVE) ×2 IMPLANT
GOWN STRL REUS W/ TWL LRG LVL3 (GOWN DISPOSABLE) ×1 IMPLANT
GOWN STRL REUS W/TWL LRG LVL3 (GOWN DISPOSABLE) ×1
IRRIGATION STRYKERFLOW (MISCELLANEOUS) ×1 IMPLANT
IRRIGATOR STRYKERFLOW (MISCELLANEOUS) ×2
LIQUID BAND (GAUZE/BANDAGES/DRESSINGS) ×2 IMPLANT
MARKER SKIN DUAL TIP RULER LAB (MISCELLANEOUS) ×2 IMPLANT
NEEDLE HYPO 25X1 1.5 SAFETY (NEEDLE) ×2 IMPLANT
NS IRRIG 1000ML POUR BTL (IV SOLUTION) ×2 IMPLANT
PACK LAP CHOLECYSTECTOMY (MISCELLANEOUS) ×2 IMPLANT
PENCIL ELECTRO HAND CTR (MISCELLANEOUS) ×2 IMPLANT
RELOAD 45 VASCULAR/THIN (ENDOMECHANICALS) ×2 IMPLANT
RELOAD STAPLE TA45 3.5 REG BLU (ENDOMECHANICALS) IMPLANT
SCALPEL HARMONIC ACE (MISCELLANEOUS) ×2 IMPLANT
SCISSORS METZENBAUM CVD 33 (INSTRUMENTS) ×2 IMPLANT
SUT MNCRL AB 4-0 PS2 18 (SUTURE) ×2 IMPLANT
SUT VICRYL 0 AB UR-6 (SUTURE) ×4 IMPLANT
SYR 20CC LL (SYRINGE) ×2 IMPLANT
TRAY FOLEY W/METER SILVER 16FR (SET/KITS/TRAYS/PACK) IMPLANT
TROCAR BALLN 12MMX100 BLUNT (TROCAR) ×2 IMPLANT
TROCAR Z-THREAD OPTICAL 5X100M (TROCAR) ×4 IMPLANT
TUBING CONNECTING 10 (TUBING) ×2 IMPLANT
WATER STERILE IRR 1000ML POUR (IV SOLUTION) IMPLANT

## 2015-04-14 NOTE — Anesthesia Procedure Notes (Signed)
Procedure Name: Intubation Date/Time: 04/14/2015 8:53 AM Performed by: Stormy FabianURTIS, Jae Bruck Pre-anesthesia Checklist: Patient identified, Patient being monitored, Timeout performed, Emergency Drugs available and Suction available Patient Re-evaluated:Patient Re-evaluated prior to inductionOxygen Delivery Method: Circle system utilized Preoxygenation: Pre-oxygenation with 100% oxygen Intubation Type: IV induction Ventilation: Mask ventilation without difficulty Laryngoscope Size: Mac and 3 Grade View: Grade I Tube type: Oral Tube size: 7.0 mm Number of attempts: 1 Airway Equipment and Method: Stylet Placement Confirmation: ETT inserted through vocal cords under direct vision,  positive ETCO2 and breath sounds checked- equal and bilateral Secured at: 21 cm Tube secured with: Tape Dental Injury: Teeth and Oropharynx as per pre-operative assessment

## 2015-04-14 NOTE — Progress Notes (Signed)
Patient awake and alert, no acute distress noted. Incisions clean dry and intact. Mother at the bedside and discharge instructions given and patient made a follow up appointment with Dr. Larwance SachsBabaoff who will be her medical doctor and will be seen as a new patient follow up. Mom given the number to Dr. Hurman HornPabon's office so patient can be seen for follow up in 1 week.

## 2015-04-14 NOTE — Op Note (Signed)
laparascopic appendectomy   Jill GainsNatalie C Leary Date of operation:  04/14/2015  Indications: The patient presented with a history of  abdominal pain. Workup has revealed findings consistent with acute appendicitis.  Pre-operative Diagnosis: Acute appendicitis without mention of peritonitis  Post-operative Diagnosis: Same  Surgeon: Sterling Bigiego Pabon, MD, FACS  Anesthesia: General with endotracheal tube  Findings: Acute non perforated appendicitis  Estimated Blood Loss: 5 cc         Specimens: appendix         Complications:  none  Procedure Details  The patient was seen again in the preop area. The options of surgery versus observation were reviewed with the patient and/or family. The risks of bleeding, infection, recurrence of symptoms, negative laparoscopy, potential for an open procedure, bowel injury, abscess or infection, were all reviewed as well. The patient was taken to Operating Room, identified as Jill Gainsatalie C Ringgold and the procedure verified as laparoscopic appendectomy. A Time Out was held and the above information confirmed.  The patient was placed in the supine position and general anesthesia was induced.  Antibiotic prophylaxis was administered and VT E prophylaxis was in place. A Foley catheter was placed by the nursing staff.   The abdomen was prepped and draped in a sterile fashion. An infraumbilical incision was made. A cutdown technique was used to enter the abdominal cavity. Two vicryl stitches were placed on the fascia and a Hasson trocar inserted. Pneumoperitoneum obtained. Two 5 mm ports were placed under direct visualization.  The appendix was identified and found to be acutely inflamed . The appendix was carefully dissected. The base of the appendix was dissected out and divided with a  vascular load Endo GIA. The mesoappendix was divided withHarmonic scalpel. The appendix was passed out through the left lateral port site with the aid of an Endo Catch bag. The right lower  quadrant and pelvis was then irrigated with copious amounts of normal saline which was aspirated. Inspection  failed to identify any additional bleeding and there were no signs of bowel injury. Umbilical fascia was closed with 0 Vicryl interrupted sutures.Again the right lower quadrant was inspected there was no sign of bleeding or bowel injury therefore pneumoperitoneum was released, all ports were removed and the skin incisions were approximated with subcuticular 4-0 Monocryl. Dermabond was applied. The patient tolerated the procedure well, there were no complications. The sponge lap and needle count were correct at the end of the procedure.  The patient was taken to the recovery room in stable condition to be admitted for continued care.    Sterling Bigiego Pabon, MD FACS

## 2015-04-14 NOTE — Anesthesia Preprocedure Evaluation (Addendum)
Anesthesia Evaluation  Patient identified by MRN, date of birth, ID band Patient awake    Reviewed: Allergy & Precautions, NPO status , Patient's Chart, lab work & pertinent test results  Airway Mallampati: II  TM Distance: >3 FB     Dental no notable dental hx.    Pulmonary neg pulmonary ROS,    Pulmonary exam normal breath sounds clear to auscultation       Cardiovascular Normal cardiovascular exam  Hx of ablation in past for SVT   Neuro/Psych negative neurological ROS  negative psych ROS   GI/Hepatic Neg liver ROS, Acute appendix   Endo/Other  negative endocrine ROS  Renal/GU negative Renal ROS     Musculoskeletal negative musculoskeletal ROS (+)   Abdominal Normal abdominal exam  (+)   Peds  Hematology negative hematology ROS (+)   Anesthesia Other Findings Hx of ablation in past for SVT Hx of Mono diagnosed witin the past week  Reproductive/Obstetrics                            Anesthesia Physical Anesthesia Plan  ASA: II  Anesthesia Plan: General   Post-op Pain Management:    Induction: Intravenous  Airway Management Planned: Oral ETT  Additional Equipment:   Intra-op Plan:   Post-operative Plan: Extubation in OR  Informed Consent: I have reviewed the patients History and Physical, chart, labs and discussed the procedure including the risks, benefits and alternatives for the proposed anesthesia with the patient or authorized representative who has indicated his/her understanding and acceptance.   Dental advisory given  Plan Discussed with: CRNA and Surgeon  Anesthesia Plan Comments:         Anesthesia Quick Evaluation

## 2015-04-14 NOTE — Discharge Summary (Signed)
Patient ID: Jill Velasquez MRN: [id number] DOB/AGE: [birth year] 23 y.o.  Admit date: 04/12/2015 Discharge date: 04/14/2015   Discharge Diagnoses:  Active Problems:   Abdominal pain   Appendicitis   Procedures: Lap appendectomy  Hospital Course: 25 year old female presented with malaise and generalized weakness and generalized aches and some abdominal pain and found to have mono and also incidental findings of possible appendicitis. My colleague Dr. Excell Seltzer saw her at that time he did not consider that she had clinical symptoms consistent with appendicitis. I came to the right day shift and she did have a persistent abdominal pain and in view of this and low-grade temperature as high to posterior for a diagnostic laparoscopy and appendectomy. And she in fact had appendicitis and we perform a laparoscopic appendectomy. She had an uneventful postoperative course at the time of discharge she was ambulating she, she was tolerating regular diet and her pain was under control. Her incisions clean dry and intact. She did have an episode of low-grade temperature that day before the operation likely related to the inflammatory process . Did offer that same overnight but this to want to go home. Since she had a syncopal episode internal medicine was consulted but they did not feel that any further cardiac workup was warranted.  Extensive counseling provided. A work excuse and his schools cues provided. Vision and patient examined discharge is stable  Consults: IM  Disposition: Final discharge disposition not confirmed  Discharge Instructions    Call MD for:  difficulty breathing, headache or visual disturbances    Complete by:  As directed      Call MD for:  hives    Complete by:  As directed      Call MD for:  persistant dizziness or light-headedness    Complete by:  As directed      Call MD for:  persistant nausea and vomiting    Complete by:  As directed      Call MD for:  redness, tenderness,  or signs of infection (pain, swelling, redness, odor or green/yellow discharge around incision site)    Complete by:  As directed      Call MD for:  severe uncontrolled pain    Complete by:  As directed      Call MD for:  temperature >100.4    Complete by:  As directed      Diet - low sodium heart healthy    Complete by:  As directed      Discharge instructions    Complete by:  As directed   May shower Sunday am. Please provide school note to be excuse for this week and next week.     Increase activity slowly    Complete by:  As directed      No dressing needed    Complete by:  As directed             Medication List    TAKE these medications        Norgestimate-Ethinyl Estradiol Triphasic 0.18/0.215/0.25 MG-35 MCG tablet  Take 1 tablet by mouth daily.     oxyCODONE-acetaminophen 7.5-325 MG tablet  Commonly known as:  PERCOCET  Take 1 tablet by mouth every 4 (four) hours as needed for severe pain.     spironolactone 25 MG tablet  Commonly known as:  ALDACTONE  Take 1 tablet by mouth daily.     traZODone 50 MG tablet  Commonly known as:  DESYREL  Take 1 tablet by mouth  at bedtime as needed.     venlafaxine XR 75 MG 24 hr capsule  Commonly known as:  EFFEXOR-XR  Take 1 capsule by mouth daily.          Sterling Bigiego Quindarrius [place], MD FACS

## 2015-04-14 NOTE — Transfer of Care (Signed)
Immediate Anesthesia Transfer of Care Note  Patient: Jill Velasquez  Procedure(s) Performed: Procedure(s): APPENDECTOMY LAPAROSCOPIC (N/A)  Patient Location: PACU  Anesthesia Type:General  Level of Consciousness: sedated  Airway & Oxygen Therapy: Patient Spontanous Breathing and Patient connected to face mask oxygen  Post-op Assessment: Report given to RN and Post -op Vital signs reviewed and stable  Post vital signs: Reviewed and stable  Last Vitals:  Filed Vitals:   04/14/15 0819 04/14/15 0956  BP: 111/75 123/72  Pulse: 107 93  Temp: 38.8 C 37.4 C  Resp: 16 16    Complications: No apparent anesthesia complications

## 2015-04-14 NOTE — Progress Notes (Signed)
Patient ID: THELDA GAGAN, female   DOB: 04-02-1990, 25 y.o.   MRN: [id number] [facility] [place] [facility]   PATIENT NAME: Ainsleigh Kakos    MR#:  [id number]  DATE OF BIRTH:  1990/09/28  SUBJECTIVE:    REVIEW OF SYSTEMS:   ROS Tolerating Diet: Tolerating PT:   DRUG ALLERGIES:  No Known Allergies  VITALS:  Blood pressure 104/63, pulse 87, temperature 98.3 F (36.8 C), temperature source Oral, resp. rate 17, height  (1.626 m), weight 52.164 kg (115 lb), last menstrual period 04/10/2015, SpO2 99 %.  PHYSICAL EXAMINATION:   Physical Exam  GENERAL:  25 y.o.-year-old patient lying in the bed with no acute distress.  EYES: Pupils equal, round, reactive to light and accommodation. No scleral icterus. Extraocular muscles intact.  HEENT: Head atraumatic, normocephalic. Oropharynx and nasopharynx clear.  NECK:  Supple, no jugular venous distention. No thyroid enlargement, no tenderness.  LUNGS: Normal breath sounds bilaterally, no wheezing, rales, rhonchi. No use of accessory muscles of respiration.  CARDIOVASCULAR: S1, S2 normal. No murmurs, rubs, or gallops.  ABDOMEN: Soft, nontender, nondistended. Bowel sounds present. No organomegaly or mass.  EXTREMITIES: No cyanosis, clubbing or edema b/l.    NEUROLOGIC: Cranial nerves II through XII are intact. No focal Motor or sensory deficits b/l.   PSYCHIATRIC:  patient is alert and oriented x 3.  SKIN: No obvious rash, lesion, or ulcer.   LABORATORY PANEL:  CBC  Recent Labs Lab 04/14/15 0420  WBC 3.5*  HGB 11.9*  HCT 34.6*  PLT 222    Chemistries   Recent Labs Lab 04/12/15 1948 04/14/15 0420  NA 130* 136  K 3.4* 3.9  CL 97* 105  CO2 26 27  GLUCOSE 92 117*  BUN 9 <5*  CREATININE 0.67 0.48  CALCIUM 8.6* 8.5*  AST 19  --   ALT 16  --   ALKPHOS 50  --   BILITOT 0.1*  --    Cardiac Enzymes  Recent Labs Lab 04/13/15 1527  TROPONINI <0.03   RADIOLOGY:  Ct Abdomen Pelvis W  Contrast  04/13/2015  CLINICAL DATA:  Flu-like symptoms yesterday. Diffuse abdominal pain tonight. EXAM: CT ABDOMEN AND PELVIS WITH CONTRAST TECHNIQUE: Multidetector CT imaging of the abdomen and pelvis was performed using the standard protocol following bolus administration of intravenous contrast. CONTRAST:  ISOVUE-300 IOPAMIDOL (ISOVUE-300) INJECTION 61% COMPARISON:  None. FINDINGS: There is inflammation and enlargement of the appendix with appearances typical of acute appendicitis. No abscess. No extraluminal air. No other acute findings are evident in the abdomen or pelvis. There are normal appearances of the liver, gallbladder, bile ducts, pancreas, spleen, adrenals and kidneys. Uterus and ovaries appear unremarkable. There is no significant abnormality in the lower chest. There is no significant musculoskeletal abnormality. IMPRESSION: Acute appendicitis. These results were called by telephone at the time of interpretation on 04/13/2015 at 2:03 am to Dr. Bayard Males , who verbally acknowledged these results. Electronically Signed   By: Ellery Plunk M.D.   On: 04/13/2015 02:04   ASSESSMENT AND PLAN:  Dyan Labarbera is a 25 y.o. female with a known history of SVT with cardiac ablation as a child presented to the hospital complaining of abdominal pain, fever and fatigue. CT scan showed acute appendicitis. Patient did not have typical symptoms of appendicitis and at this point conservative management is being followed.   * Syncope Seems orthostatic. Orthostatic vitals improved. Continue IVF. Pt asymtomatic Telemetry monitoring. Mild tachy. Troponin negative No  chest pain or shortness of breath.  * Infectious mononucleosis Symptomatic treatment. No splenomegaly on CT scan.  * Acute appendicitis on CT scan Management as per surgery. Pt is s/p POD # 0 appendectomy  Overall stable Will sigh off call if needed Thanks.   Case discussed with Care Management/Social Worker. Management  plans discussed with the patient, family and they are in agreement.  CODE STATUS: full  DVT Prophylaxis: per surgery  TOTAL TIME TAKING CARE OF THIS PATIENT: 30 minutes.  >50% time spent on counselling and coordination of care pt, mother  Note: This dictation was prepared with Dragon dictation along with smaller phrase technology. Any transcriptional errors that result from this process are unintentional.  Graysen Woodyard M.D on 04/14/2015 at 11:55 AM  Between 7am to 6pm - Pager - [phone]  After 6pm go to [email or web address] - password [email or web address] [facility]  [facility]  Office  [phone]  CC: Primary care physician; BABAOFF, Lavada MesiMARC E, MD

## 2015-04-14 NOTE — Progress Notes (Signed)
Preoperative Review   Patient is met in the preoperative holding area. The history is reviewed in the chart and with the patient. I personally reviewed the options and rationale as well as the risks of this procedure that have been previously discussed with the patient. All questions asked by the patient and/or family were answered to their satisfaction.  Patient agrees to proceed with this procedure at this time. Plan for Dx lap and appendectomy possible open. She has persistent pain and now tenderness localized to RLQ. I do think she has concomitant appendicitis and mono. D/W pt and family in detail  Caroleen Hamman M.D. FACS

## 2015-04-15 NOTE — Anesthesia Postprocedure Evaluation (Signed)
Anesthesia Post Note  Patient: Jill Velasquez  Procedure(s) Performed: Procedure(s) (LRB): APPENDECTOMY LAPAROSCOPIC (N/A)  Patient location during evaluation: PACU Anesthesia Type: General Level of consciousness: awake and alert and oriented Pain management: pain level controlled Vital Signs Assessment: post-procedure vital signs reviewed and stable Respiratory status: spontaneous breathing Cardiovascular status: blood pressure returned to baseline Anesthetic complications: no    Last Vitals:  Filed Vitals:   04/14/15 1055 04/14/15 1108  BP:  104/63  Pulse: 83 87  Temp: 37.5 C 36.8 C  Resp: 13 17    Last Pain:  Filed Vitals:   04/14/15 1530  PainSc: 4                  Malaquias Lenker

## 2015-04-17 ENCOUNTER — Other Ambulatory Visit: Payer: Self-pay | Admitting: Surgery

## 2015-04-17 LAB — SURGICAL PATHOLOGY

## 2015-04-17 NOTE — Telephone Encounter (Signed)
Patient has called and requested a refill on Zofran due to nausea that has started today ([facility]). Patient has no other symptoms. Patient had a laparoscopic appendectomy with Dr Everlene FarrierPabon on 04/16/15. Please call patient once this is completed.   Patient has a post op appointment on 05/01/15 @ 4:00pm with Dr [place]ab[place].

## 2015-04-18 MED ORDER — ONDANSETRON HCL 4 MG PO TABS: 4.0000 mg | ORAL_TABLET | Freq: Three times a day (TID) | ORAL | Status: DC | PRN

## 2015-04-18 NOTE — Telephone Encounter (Signed)
Called patient back to let her know that a refill on her Zofran was sent to her pharmacy [facility].

## 2015-04-25 DIAGNOSIS — F411 Generalized anxiety disorder: Secondary | ICD-10-CM | POA: Insufficient documentation

## 2015-04-25 DIAGNOSIS — F329 Major depressive disorder, single episode, unspecified: Secondary | ICD-10-CM | POA: Insufficient documentation

## 2015-05-01 ENCOUNTER — Encounter: Payer: Self-pay | Admitting: Surgery

## 2015-05-01 ENCOUNTER — Ambulatory Visit (INDEPENDENT_AMBULATORY_CARE_PROVIDER_SITE_OTHER): Payer: BLUE CROSS/BLUE SHIELD | Admitting: Surgery

## 2015-05-01 ENCOUNTER — Other Ambulatory Visit: Payer: Self-pay

## 2015-05-01 VITALS — BP 115/73 | HR 73 | Temp 98.2°F | Wt 120.0 lb

## 2015-05-01 DIAGNOSIS — Z09 Encounter for follow-up examination after completed treatment for conditions other than malignant neoplasm: Secondary | ICD-10-CM

## 2015-05-01 NOTE — Patient Instructions (Signed)

## 2015-05-02 NOTE — Progress Notes (Signed)
S/p lap appy, path d/w pt She is doing great, no complaints, taking PO  PE NAD Abd: soft, NT, incisions c/d/i, no infection  A/P doing very well No heavy lifting RTC prn

## 2017-10-30 IMAGING — CT CT ABD-PELV W/ CM
1 of 2 series · 15 of 32 positions shown, 19 images · IV contrast (iopamidol)
Comparison: None.

CLINICAL DATA: Flu-like symptoms yesterday. Diffuse abdominal pain
tonight.

EXAM:
CT ABDOMEN AND PELVIS WITH CONTRAST
TECHNIQUE: Multidetector CT imaging of the abdomen and pelvis was performed
using the standard protocol following bolus administration of
intravenous contrast.
CONTRAST:  100mL SXIAD6-9BB IOPAMIDOL (SXIAD6-9BB) INJECTION 61%

[Series 2: routine abd pel with · axial · 0.73mm/px · z∈[-444,-38]mm · 15 of 89 slices shown, 19 images]
[im 4/89  soft-tissue]
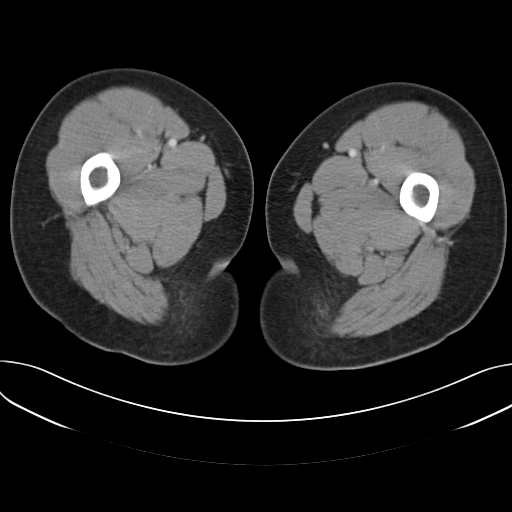
[im 4/89  bone]
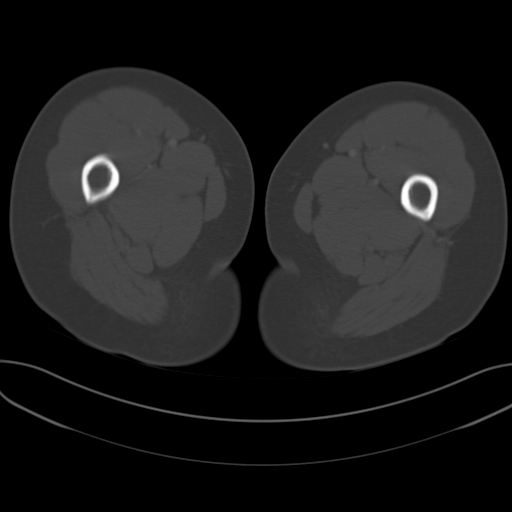
[im 12/89  soft-tissue]
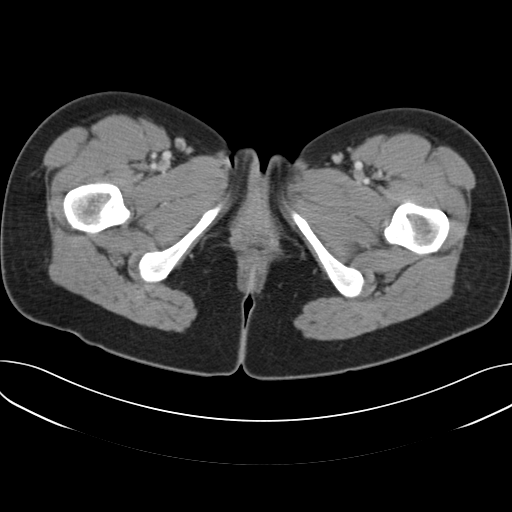
[im 19/89  soft-tissue]
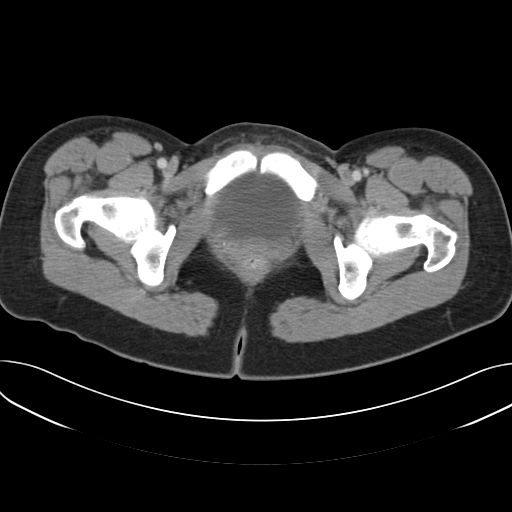
[im 26/89  soft-tissue]
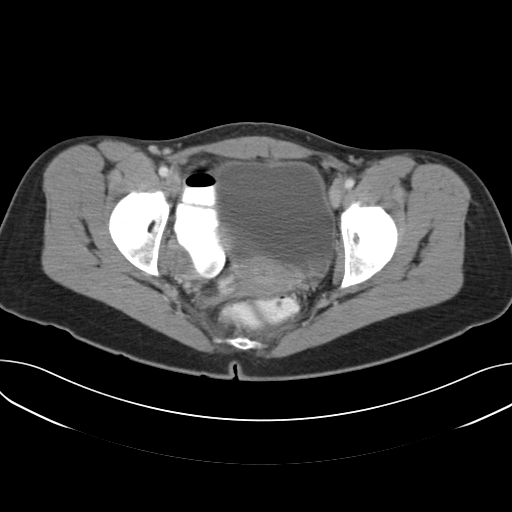
[im 30/89  soft-tissue]
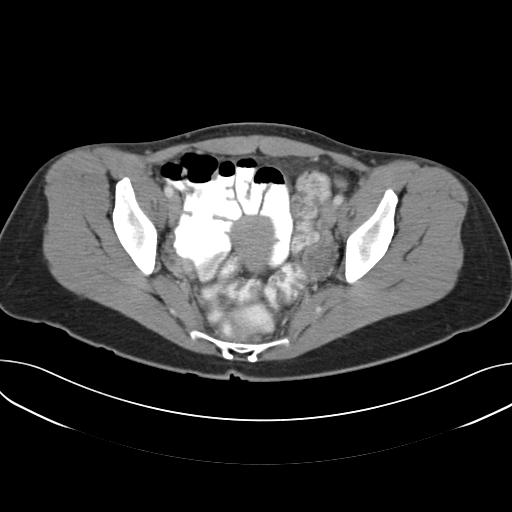
[im 37/89  soft-tissue]
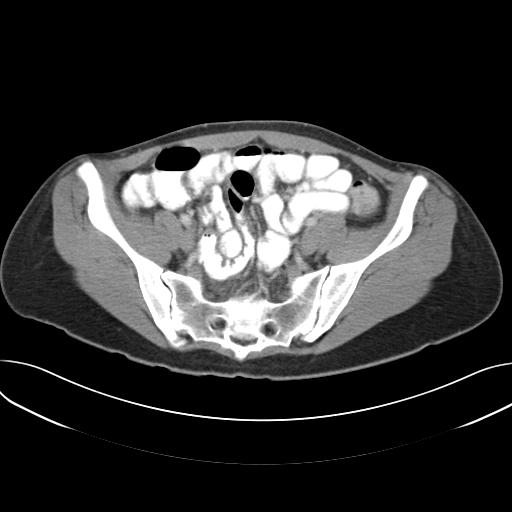
[im 45/89  soft-tissue]
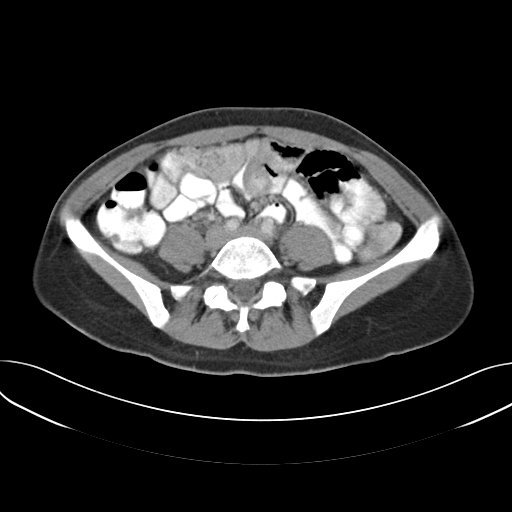
[im 52/89  soft-tissue]
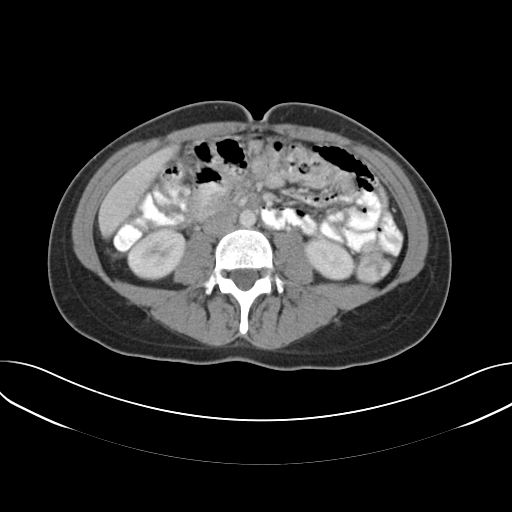
[im 59/89  soft-tissue]
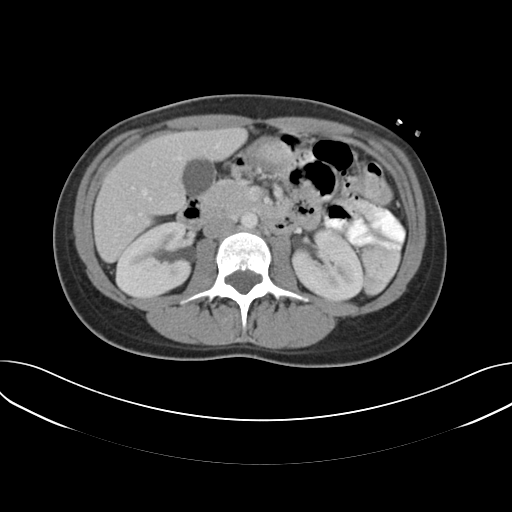
[im 59/89  bone]
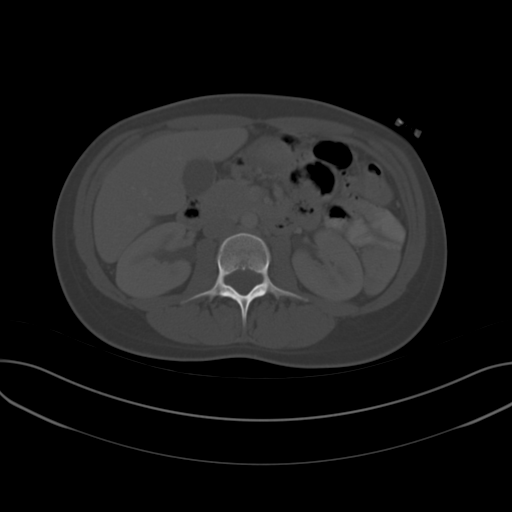
[im 63/89  soft-tissue]
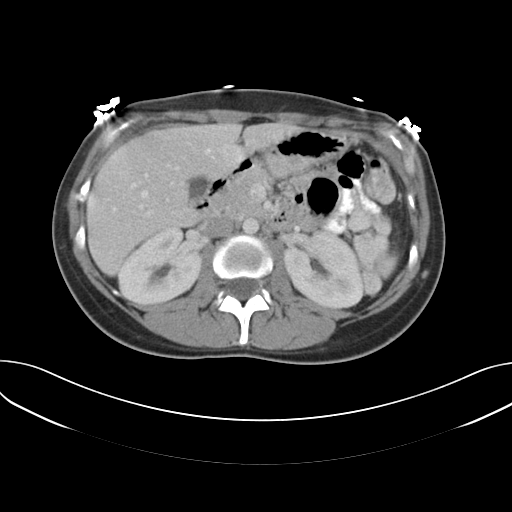
[im 70/89  soft-tissue]
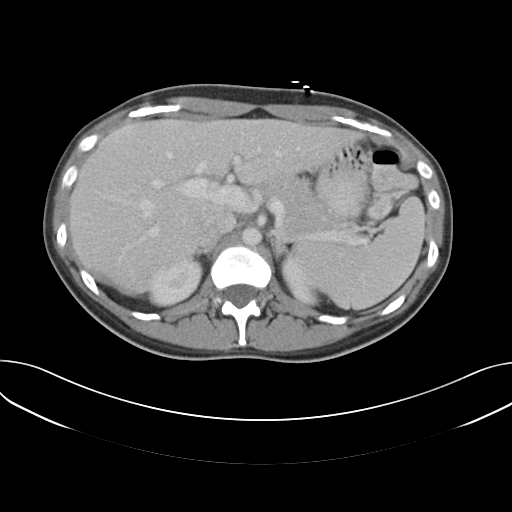
[im 74/89  lung]
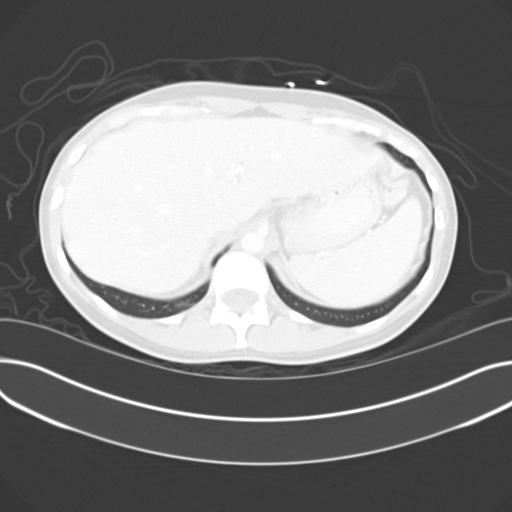
[im 78/89  soft-tissue]
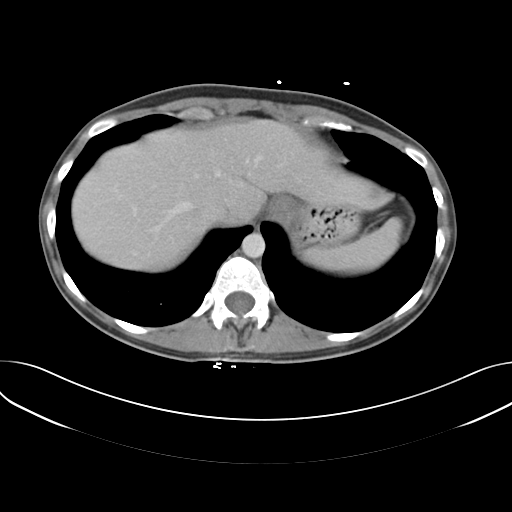
[im 78/89  lung]
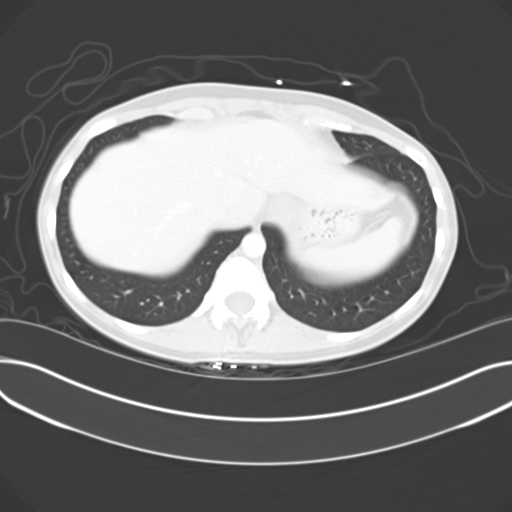
[im 81/89  lung]
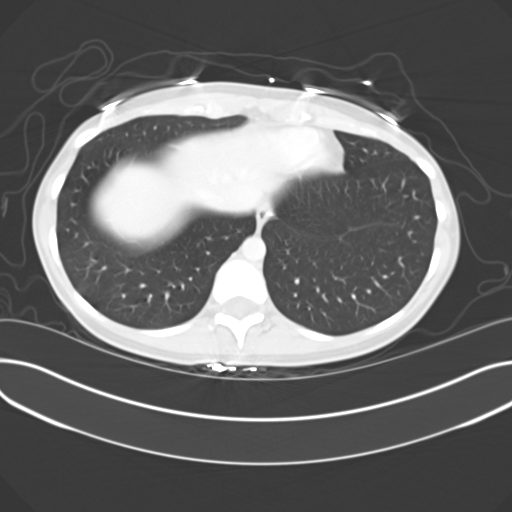
[im 85/89  soft-tissue]
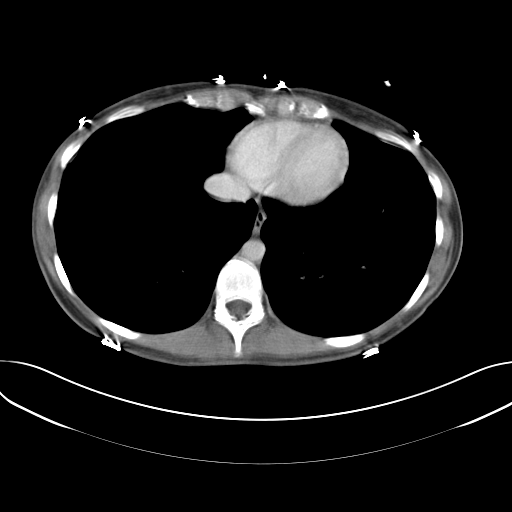
[im 85/89  lung]
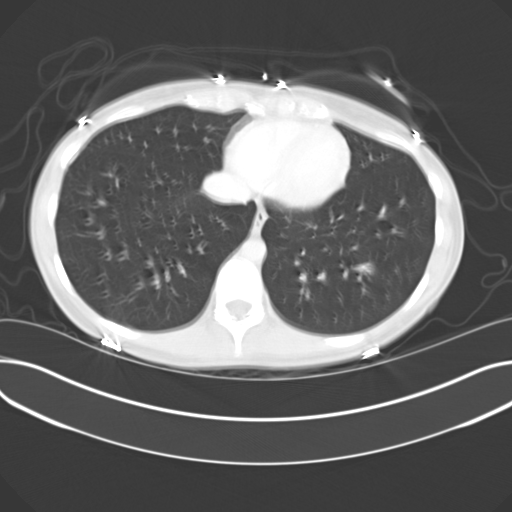

[15 of 32 positions shown; findings below may reference images not displayed]

FINDINGS: There is inflammation and enlargement of the appendix with
appearances typical of acute appendicitis. No abscess. No
extraluminal air. No other acute findings are evident in the abdomen
or pelvis.

There are normal appearances of the liver, gallbladder, bile ducts,
pancreas, spleen, adrenals and kidneys. Uterus and ovaries appear
unremarkable.

There is no significant abnormality in the lower chest.

There is no significant musculoskeletal abnormality.
IMPRESSION: Acute appendicitis. These results were called by telephone at the
time of interpretation on 04/13/2015 at [DATE] to Dr. BEULAH FONG
, who verbally acknowledged these results.
